# Patient Record
Sex: Male | Born: 1935 | Race: White | Hispanic: No | Marital: Married | Smoking: Former smoker
Health system: Southern US, Community
[De-identification: ages and names within clinical notes are randomized; demographics above are authoritative.]

## PROBLEM LIST (undated history)

## (undated) DIAGNOSIS — E785 Hyperlipidemia, unspecified: Secondary | ICD-10-CM

## (undated) DIAGNOSIS — C801 Malignant (primary) neoplasm, unspecified: Secondary | ICD-10-CM

## (undated) DIAGNOSIS — I10 Essential (primary) hypertension: Secondary | ICD-10-CM

## (undated) DIAGNOSIS — C61 Malignant neoplasm of prostate: Secondary | ICD-10-CM

## (undated) HISTORY — DX: Hyperlipidemia, unspecified: E78.5

## (undated) HISTORY — DX: Malignant neoplasm of prostate: C61

## (undated) HISTORY — PX: OTHER SURGICAL HISTORY: SHX169

---

## 2018-11-01 ENCOUNTER — Emergency Department (HOSPITAL_BASED_OUTPATIENT_CLINIC_OR_DEPARTMENT_OTHER): Payer: Medicare Other

## 2018-11-01 ENCOUNTER — Encounter (HOSPITAL_BASED_OUTPATIENT_CLINIC_OR_DEPARTMENT_OTHER): Payer: Self-pay | Admitting: Emergency Medicine

## 2018-11-01 ENCOUNTER — Other Ambulatory Visit: Payer: Self-pay

## 2018-11-01 ENCOUNTER — Emergency Department (HOSPITAL_BASED_OUTPATIENT_CLINIC_OR_DEPARTMENT_OTHER)
Admission: EM | Admit: 2018-11-01 | Discharge: 2018-11-01 | Disposition: A | Discharge status: Home / Self Care | Payer: Medicare Other | Attending: Emergency Medicine | Admitting: Emergency Medicine

## 2018-11-01 DIAGNOSIS — R0789 Other chest pain: Secondary | ICD-10-CM | POA: Diagnosis not present

## 2018-11-01 DIAGNOSIS — I10 Essential (primary) hypertension: Secondary | ICD-10-CM | POA: Insufficient documentation

## 2018-11-01 DIAGNOSIS — Z87891 Personal history of nicotine dependence: Secondary | ICD-10-CM | POA: Diagnosis not present

## 2018-11-01 HISTORY — DX: Malignant (primary) neoplasm, unspecified: C80.1

## 2018-11-01 HISTORY — DX: Essential (primary) hypertension: I10

## 2018-11-01 LAB — COMPREHENSIVE METABOLIC PANEL
ALT: 16 U/L (ref 0–44)
AST: 17 U/L (ref 15–41)
Albumin: 3.7 g/dL (ref 3.5–5.0)
Alkaline Phosphatase: 55 U/L (ref 38–126)
Anion gap: 11 (ref 5–15)
BUN: 13 mg/dL (ref 8–23)
CO2: 24 mmol/L (ref 22–32)
Calcium: 9 mg/dL (ref 8.9–10.3)
Chloride: 96 mmol/L — ABNORMAL LOW (ref 98–111)
Creatinine, Ser: 0.79 mg/dL (ref 0.61–1.24)
GFR calc Af Amer: >60 mL/min (ref >60)
GFR calc non Af Amer: >60 mL/min (ref >60)
Glucose, Bld: 104 mg/dL — ABNORMAL HIGH (ref 70–99)
Potassium: 3.2 mmol/L — ABNORMAL LOW (ref 3.5–5.1)
Sodium: 131 mmol/L — ABNORMAL LOW (ref 135–145)
Total Bilirubin: 1.1 mg/dL (ref 0.3–1.2)
Total Protein: 6.4 g/dL — ABNORMAL LOW (ref 6.5–8.1)

## 2018-11-01 LAB — D-DIMER, QUANTITATIVE: D-Dimer, Quant: 0.55 ug/mL-FEU — ABNORMAL HIGH (ref 0.00–0.50)

## 2018-11-01 LAB — CBC WITH DIFFERENTIAL/PLATELET
Abs Immature Granulocytes: 0.05 10*3/uL (ref 0.00–0.07)
Basophils Absolute: 0 10*3/uL (ref 0.0–0.1)
Basophils Relative: 0 %
Eosinophils Absolute: 0 10*3/uL (ref 0.0–0.5)
Eosinophils Relative: 0 %
HCT: 39 % (ref 39.0–52.0)
Hemoglobin: 13.2 g/dL (ref 13.0–17.0)
Immature Granulocytes: 0 %
Lymphocytes Relative: 5 %
Lymphs Abs: 0.5 10*3/uL — ABNORMAL LOW (ref 0.7–4.0)
MCH: 31.4 pg (ref 26.0–34.0)
MCHC: 33.8 g/dL (ref 30.0–36.0)
MCV: 92.9 fL (ref 80.0–100.0)
Monocytes Absolute: 1.1 10*3/uL — ABNORMAL HIGH (ref 0.1–1.0)
Monocytes Relative: 9 %
Neutro Abs: 10.3 10*3/uL — ABNORMAL HIGH (ref 1.7–7.7)
Neutrophils Relative %: 86 %
Platelets: 134 10*3/uL — ABNORMAL LOW (ref 150–400)
RBC: 4.2 MIL/uL — ABNORMAL LOW (ref 4.22–5.81)
RDW: 14 % (ref 11.5–15.5)
WBC: 12 10*3/uL — ABNORMAL HIGH (ref 4.0–10.5)
nRBC: 0 % (ref 0.0–0.2)

## 2018-11-01 LAB — TROPONIN I (HIGH SENSITIVITY)
Troponin I (High Sensitivity): 8 ng/L (ref <18)
Troponin I (High Sensitivity): 11 ng/L (ref <18)

## 2018-11-01 LAB — LIPASE, BLOOD: Lipase: 22 U/L (ref 11–51)

## 2018-11-01 MED ORDER — MAGNESIUM SULFATE 2 GM/50ML IV SOLN: 2.0000 g | Freq: Once | INTRAVENOUS | Status: DC

## 2018-11-01 MED ORDER — POTASSIUM CHLORIDE CRYS ER 20 MEQ PO TBCR
40.0000 meq | EXTENDED_RELEASE_TABLET | Freq: Once | ORAL | Status: DC
  Filled 2018-11-01: qty 2

## 2018-11-01 MED ORDER — POTASSIUM CHLORIDE 20 MEQ/15ML (10%) PO SOLN
40.0000 meq | Freq: Once | ORAL | Status: AC
  Administered 2018-11-01: 13:00:00 40 meq via ORAL
  Filled 2018-11-01: qty 30

## 2018-11-01 MED ORDER — MAGNESIUM SULFATE 2 GM/50ML IV SOLN
2.0000 g | Freq: Once | INTRAVENOUS | Status: AC
  Administered 2018-11-01: 12:00:00 2 g via INTRAVENOUS
  Filled 2018-11-01: qty 50

## 2018-11-01 NOTE — ED Notes (Signed)
Pt verbalized understanding of dc instructions.

## 2018-11-01 NOTE — ED Triage Notes (Signed)
Centralized chest pain x days. Worse with inspiration and lying flat.

## 2018-11-01 NOTE — Discharge Instructions (Signed)
Follow up with your family doc. Return for worsening symptoms.  °

## 2018-11-01 NOTE — ED Provider Notes (Signed)
[facility] EMERGENCY DEPARTMENT Provider Note   CSN: [id number] Arrival date & time: 11/01/18  [id number]     History   Chief Complaint Chief Complaint  Patient presents with  . Chest Pain    HPI William Bowen is a 85 y.o. male.     85 yo M with a chief complaints of chest pain.  Going on for a couple days.  Worse when he lies back flat or when he takes a deep breath.  Describes it as a sharp pain.  No radiation no shortness of breath.  The patient has a Public librarian and he called him and the physician discussed with him the different possible etiologies of his pain and recommended he come to the ED for evaluation.  Patient denies history of MI.  Has hypertension hyperlipidemia and family history of MI.  Patient has a remote smoking history denies diabetes.  Patient denies history of PE or DVT denies unilateral lower extremity edema denies hemoptysis denies recent surgery immobilization or hospitalization.  Has remote history of prostate cancer that has been in remission since 04.  The history is provided by the patient.  Chest Pain Pain location:  Substernal area Pain quality: sharp   Pain radiates to:  Does not radiate Pain severity:  Moderate Onset quality:  Sudden Duration:  2 days Timing:  Constant Progression:  Waxing and waning Chronicity:  New Relieved by:  Nothing Worsened by:  Nothing Ineffective treatments:  None tried Associated symptoms: no abdominal pain, no fever, no headache, no palpitations, no shortness of breath and no vomiting     Past Medical History:  Diagnosis Date  . Cancer ([place])   . Hypertension     There are no active problems to display for this patient.   History reviewed. No pertinent surgical history.      Home Medications    Prior to Admission medications   Not on File    Family History No family history on file.  Social History Social History   Tobacco Use  . Smoking status: Former Research scientist (life sciences)  . Smokeless  tobacco: Never Used  Substance Use Topics  . Alcohol use: Not Currently  . Drug use: Not on file     Allergies   Patient has no known allergies.   Review of Systems Review of Systems  Constitutional: Negative for chills and fever.  HENT: Negative for congestion and facial swelling.   Eyes: Negative for discharge and visual disturbance.  Respiratory: Negative for shortness of breath.   Cardiovascular: Positive for chest pain. Negative for palpitations.  Gastrointestinal: Negative for abdominal pain, diarrhea and vomiting.  Musculoskeletal: Negative for arthralgias and myalgias.  Skin: Negative for color change and rash.  Neurological: Negative for tremors, syncope and headaches.  Psychiatric/Behavioral: Negative for confusion and dysphoric mood.     Physical Exam Updated Vital Signs BP (!) 142/56   Pulse 63   Temp 97.9 F (36.6 C) (Oral)   Resp 14   Ht 5\' 9"  (1.753 m)   Wt 65.8 kg   SpO2 97%   BMI 21.41 kg/m   Physical Exam Vitals signs and nursing note reviewed.  Constitutional:      Appearance: He is well-developed.  HENT:     Head: Normocephalic and atraumatic.  Eyes:     Pupils: Pupils are equal, round, and reactive to light.  Neck:     Musculoskeletal: Normal range of motion and neck supple.     Vascular: No JVD.  Cardiovascular:  Rate and Rhythm: Normal rate and regular rhythm.     Heart sounds: No murmur. No friction rub. No gallop.   Pulmonary:     Effort: No respiratory distress.     Breath sounds: No wheezing.  Chest:     Chest wall: Tenderness present.     Comments: Patient points to the area with one finger on his left anterior chest wall just left of the sternal border about ribs 5 and 6.  Has some tenderness with palpation. Abdominal:     General: There is no distension.     Tenderness: There is no guarding or rebound.     Comments: Patient has some mild discomfort when I push in the epigastrium that he feels like radiates up into his  chest.  Musculoskeletal: Normal range of motion.  Skin:    Coloration: Skin is not pale.     Findings: No rash.  Neurological:     Mental Status: He is alert and oriented to person, place, and time.  Psychiatric:        Behavior: Behavior normal.      ED Treatments / Results  Labs (all labs ordered are listed, but only abnormal results are displayed) Labs Reviewed  CBC WITH DIFFERENTIAL/PLATELET - Abnormal; Notable for the following components:      Result Value   WBC 12.0 (*)    RBC 4.20 (*)    Platelets 134 (*)    Neutro Abs 10.3 (*)    Lymphs Abs 0.5 (*)    Monocytes Absolute 1.1 (*)    All other components within normal limits  COMPREHENSIVE METABOLIC PANEL - Abnormal; Notable for the following components:   Sodium 131 (*)    Potassium 3.2 (*)    Chloride 96 (*)    Glucose, Bld 104 (*)    Total Protein 6.4 (*)    All other components within normal limits  D-DIMER, QUANTITATIVE (NOT AT [facility]) - Abnormal; Notable for the following components:   D-Dimer, Quant 0.55 (*)    All other components within normal limits  LIPASE, BLOOD  TROPONIN I (HIGH SENSITIVITY)  TROPONIN I (HIGH SENSITIVITY)    EKG EKG Interpretation  Date/Time:  Saturday November 01 2018 09:59:01 EDT Ventricular Rate:  105 PR Interval:    QRS Duration: 139 QT Interval:  363 QTC Calculation: 480 R Axis:   65 Text Interpretation: Normal sinus rhythm Ventricular bigeminy Right bundle branch block Baseline wander in lead(s) V3 No old tracing to compare Confirmed by Deno Etienne [id number]) on 11/01/2018 10:25:34 AM   Radiology Dg Chest Port 1 View  Result Date: 11/01/2018 CLINICAL DATA:  Centralized chest pain for the past 2 days, worse with inspiration and lying flat. EXAM: PORTABLE CHEST 1 VIEW COMPARISON:  None. FINDINGS: Heart size near the upper limit of normal. Mild bibasilar atelectasis. Otherwise, clear lungs. Thoracic spine degenerative changes. Diffuse osteopenia. IMPRESSION: Mild bibasilar  atelectasis. Electronically Signed   By: Claudie Revering M.D.   On: 11/01/2018 10:39    Procedures Procedures (including critical care time)  Medications Ordered in ED Medications  potassium chloride SA (KLOR-CON) CR tablet 40 mEq (40 mEq Oral Not Given 11/01/18 1211)  magnesium sulfate IVPB 2 g 50 mL (0 g Intravenous Stopped 11/01/18 1315)  potassium chloride 20 MEQ/15ML (10%) solution 40 mEq (40 mEq Oral Given 11/01/18 1315)     Initial Impression / Assessment and Plan / ED Course  I have reviewed the triage vital signs and the nursing notes.  Pertinent  labs & imaging results that were available during my care of the patient were reviewed by me and considered in my medical decision making (see chart for details).        85 yo M with a chief complaint of chest pain.  This is atypical in nature from ACS.  He has been able to exert himself and walk up to a mile and a half without any symptoms of shortness of breath.  Pain is worse with laying back flat or taking deep breaths.  Could be muscular based on his history of those not definitively reproduced with palpation.  Will obtain troponin and D-dimer reassess.   Ddimer age adjusted negative.  Trop negative x 2. labwork otherwise unremarkable.  CXR viewed by me without focal infiltrate or ptx.  D/c home, PCP follow up.   3:10 PM:  I have discussed the diagnosis/risks/treatment options with the patient and family and believe the pt to be eligible for discharge home to follow-up with PCP. We also discussed returning to the ED immediately if new or worsening sx occur. We discussed the sx which are most concerning (e.g., sudden worsening pain, fever, inability to tolerate by mouth) that necessitate immediate return. Medications administered to the patient during their visit and any new prescriptions provided to the patient are listed below.  Medications given during this visit Medications  potassium chloride SA (KLOR-CON) CR tablet 40 mEq (40  mEq Oral Not Given 11/01/18 1211)  magnesium sulfate IVPB 2 g 50 mL (0 g Intravenous Stopped 11/01/18 1315)  potassium chloride 20 MEQ/15ML (10%) solution 40 mEq (40 mEq Oral Given 11/01/18 1315)     The patient appears reasonably screen and/or stabilized for discharge and I doubt any other medical condition or other [facility] requiring further screening, evaluation, or treatment in the ED at this time prior to discharge.   Final Clinical Impressions(s) / ED Diagnoses   Final diagnoses:  Atypical chest pain    ED Discharge Orders    None       Deno Etienne, DO 11/01/18 1510

## 2020-01-29 ENCOUNTER — Emergency Department (HOSPITAL_BASED_OUTPATIENT_CLINIC_OR_DEPARTMENT_OTHER)
Admission: EM | Admit: 2020-01-29 | Discharge: 2020-01-29 | Disposition: A | Discharge status: Home / Self Care | Payer: Medicare Other | Attending: Emergency Medicine | Admitting: Emergency Medicine

## 2020-01-29 ENCOUNTER — Encounter (HOSPITAL_BASED_OUTPATIENT_CLINIC_OR_DEPARTMENT_OTHER): Payer: Self-pay

## 2020-01-29 ENCOUNTER — Other Ambulatory Visit: Payer: Self-pay

## 2020-01-29 DIAGNOSIS — I10 Essential (primary) hypertension: Secondary | ICD-10-CM | POA: Diagnosis not present

## 2020-01-29 DIAGNOSIS — Z87891 Personal history of nicotine dependence: Secondary | ICD-10-CM | POA: Insufficient documentation

## 2020-01-29 DIAGNOSIS — Z859 Personal history of malignant neoplasm, unspecified: Secondary | ICD-10-CM | POA: Diagnosis not present

## 2020-01-29 DIAGNOSIS — R202 Paresthesia of skin: Secondary | ICD-10-CM | POA: Insufficient documentation

## 2020-01-29 LAB — COMPREHENSIVE METABOLIC PANEL
ALT: 16 U/L (ref 0–44)
AST: 16 U/L (ref 15–41)
Albumin: 3.7 g/dL (ref 3.5–5.0)
Alkaline Phosphatase: 44 U/L (ref 38–126)
Anion gap: 10 (ref 5–15)
BUN: 19 mg/dL (ref 8–23)
CO2: 27 mmol/L (ref 22–32)
Calcium: 9 mg/dL (ref 8.9–10.3)
Chloride: 96 mmol/L — ABNORMAL LOW (ref 98–111)
Creatinine, Ser: 0.92 mg/dL (ref 0.61–1.24)
GFR, Estimated: >60 mL/min (ref >60)
Glucose, Bld: 103 mg/dL — ABNORMAL HIGH (ref 70–99)
Potassium: 3 mmol/L — ABNORMAL LOW (ref 3.5–5.1)
Sodium: 133 mmol/L — ABNORMAL LOW (ref 135–145)
Total Bilirubin: 0.8 mg/dL (ref 0.3–1.2)
Total Protein: 6.1 g/dL — ABNORMAL LOW (ref 6.5–8.1)

## 2020-01-29 LAB — CBC WITH DIFFERENTIAL/PLATELET
Abs Immature Granulocytes: 0.05 10*3/uL (ref 0.00–0.07)
Basophils Absolute: 0 10*3/uL (ref 0.0–0.1)
Basophils Relative: 0 %
Eosinophils Absolute: 0.1 10*3/uL (ref 0.0–0.5)
Eosinophils Relative: 1 %
HCT: 34.1 % — ABNORMAL LOW (ref 39.0–52.0)
Hemoglobin: 11.9 g/dL — ABNORMAL LOW (ref 13.0–17.0)
Immature Granulocytes: 1 %
Lymphocytes Relative: 12 %
Lymphs Abs: 0.8 10*3/uL (ref 0.7–4.0)
MCH: 32.2 pg (ref 26.0–34.0)
MCHC: 34.9 g/dL (ref 30.0–36.0)
MCV: 92.4 fL (ref 80.0–100.0)
Monocytes Absolute: 0.6 10*3/uL (ref 0.1–1.0)
Monocytes Relative: 9 %
Neutro Abs: 5.1 10*3/uL (ref 1.7–7.7)
Neutrophils Relative %: 77 %
Platelets: 162 10*3/uL (ref 150–400)
RBC: 3.69 MIL/uL — ABNORMAL LOW (ref 4.22–5.81)
RDW: 13.7 % (ref 11.5–15.5)
WBC: 6.6 10*3/uL (ref 4.0–10.5)
nRBC: 0 % (ref 0.0–0.2)

## 2020-01-29 MED ORDER — POTASSIUM CHLORIDE CRYS ER 20 MEQ PO TBCR
40.0000 meq | EXTENDED_RELEASE_TABLET | Freq: Once | ORAL | Status: AC
  Administered 2020-01-29: 40 meq via ORAL
  Filled 2020-01-29: qty 2

## 2020-01-29 NOTE — ED Triage Notes (Signed)
Pt states he woke ~830am with numbness to left pinky finger/side of hand-denies injury-NAD-steady gait

## 2020-01-29 NOTE — ED Notes (Signed)
Numbness to side of left hand  Woke with it this am

## 2020-01-29 NOTE — Discharge Instructions (Addendum)
I do not suspect a stroke given your symptoms only affect the fourth and fifth digits of your left hand.  If Symptoms persist for more than 1 or 2 days, you may benefit from nerve conduction studies.  Call your doctor tomorrow morning to arrange follow-up as needed.  Return immediately to the ER if you have new numbness weakness or any additional concerns.

## 2020-01-29 NOTE — ED Notes (Signed)
Discussed case with EDP-orders received

## 2020-01-29 NOTE — ED Notes (Signed)
EKG to EDP Sch[place]

## 2020-01-29 NOTE — ED Provider Notes (Signed)
[facility] EMERGENCY DEPARTMENT Provider Note   CSN: [id number] Arrival date & time: 01/29/20  1348     History Chief Complaint  Patient presents with  . Numbness    William Bowen is a 87 y.o. male.  Patient presents to ER chief complaint of left hand fifth digit and fourth digit tingling and numbness.  He states his symptoms began when he woke up this morning around 7 AM.  He states he went to bed last night feeling fine.  When he woke up he noticed tingling in his left hand.  Persisted through the day and he called his doctor who advised him to come to the ER for further evaluation.  Patient otherwise denies any pain or discomfort.  Denies any weakness.  Denies any headache or chest pain or abdominal pain.        Past Medical History:  Diagnosis Date  . Cancer ([place])   . Hypertension     There are no problems to display for this patient.   History reviewed. No pertinent surgical history.     No family history on file.  Social History   Tobacco Use  . Smoking status: Former Research scientist (life sciences)  . Smokeless tobacco: Never Used  Substance Use Topics  . Alcohol use: Not Currently  . Drug use: Never    Home Medications Prior to Admission medications   Not on File    Allergies    Patient has no known allergies.  Review of Systems   Review of Systems  Constitutional: Negative for fever.  HENT: Negative for ear pain and sore throat.   Eyes: Negative for pain.  Respiratory: Negative for cough.   Cardiovascular: Negative for chest pain.  Gastrointestinal: Negative for abdominal pain.  Genitourinary: Negative for flank pain.  Musculoskeletal: Negative for back pain.  Skin: Negative for color change and rash.  Neurological: Negative for syncope.  All other systems reviewed and are negative.   Physical Exam Updated Vital Signs BP (!) 136/56 (BP Location: Left Arm)   Pulse 65   Temp (!) 97.5 F (36.4 C) (Tympanic)   Resp 18   Ht 5\' 9"  (1.753 m)   Wt  73.9 kg   SpO2 99%   BMI 24.07 kg/m   Physical Exam Constitutional:      General: He is not in acute distress.    Appearance: He is well-developed.  HENT:     Head: Normocephalic.     Nose: Nose normal.  Eyes:     Extraocular Movements: Extraocular movements intact.  Cardiovascular:     Rate and Rhythm: Normal rate.  Pulmonary:     Effort: Pulmonary effort is normal.  Musculoskeletal:     Comments: Chronic appearing deformity the left elbow.  Patient is unable to fully supinate the left forearm due to the deformity of the left elbow.  Skin:    Coloration: Skin is not jaundiced.  Neurological:     Mental Status: He is alert. Mental status is at baseline.     Comments: Cranial nerves II to XII intact.  Strength 5/5 all extremities.  Finger-nose heel-to-shin intact.  No hand/forearm drifting noted, grip strength bilaterally 5/5 strength.  No weakness to bilateral finger abduction and adduction.       ED Results / Procedures / Treatments   Labs (all labs ordered are listed, but only abnormal results are displayed) Labs Reviewed  CBC WITH DIFFERENTIAL/PLATELET - Abnormal; Notable for the following components:      Result Value  RBC 3.69 (*)    Hemoglobin 11.9 (*)    HCT 34.1 (*)    All other components within normal limits  COMPREHENSIVE METABOLIC PANEL - Abnormal; Notable for the following components:   Sodium 133 (*)    Potassium 3.0 (*)    Chloride 96 (*)    Glucose, Bld 103 (*)    Total Protein 6.1 (*)    All other components within normal limits    EKG None  Radiology No results found.  Procedures Procedures   Medications Ordered in ED Medications - No data to display  ED Course  I have reviewed the triage vital signs and the nursing notes.  Pertinent labs & imaging results that were available during my care of the patient were reviewed by me and considered in my medical decision making (see chart for details).    MDM Rules/Calculators/A&P                           Given exam here I doubt CVA or intracranial pathology.  I suspect nerve palsy perhaps compression neuropathy.  Likely exacerbated by the patient's chronic deformity of the left elbow.  No other focal neuro deficit noted.  Recommending outpatient follow-up with his doctors perhaps follow-up with neurology for nerve conduction studies as needed within the week.  Advised immediate return for new numbness weakness or any additional concerns.   Final Clinical Impression(s) / ED Diagnoses Final diagnoses:  None    Rx / DC Orders ED Discharge Orders    None       Luna Fuse, MD 01/29/20 [phone]

## 2020-02-02 DEATH — deceased

## 2020-05-19 IMAGING — DX DG CHEST 1V PORT
1 series · 1 of 1 positions shown · non-contrast
Comparison: None.

CLINICAL DATA: Centralized chest pain for the past 2 days, worse
with inspiration and lying flat.

EXAM:
PORTABLE CHEST 1 VIEW

[chest ap]
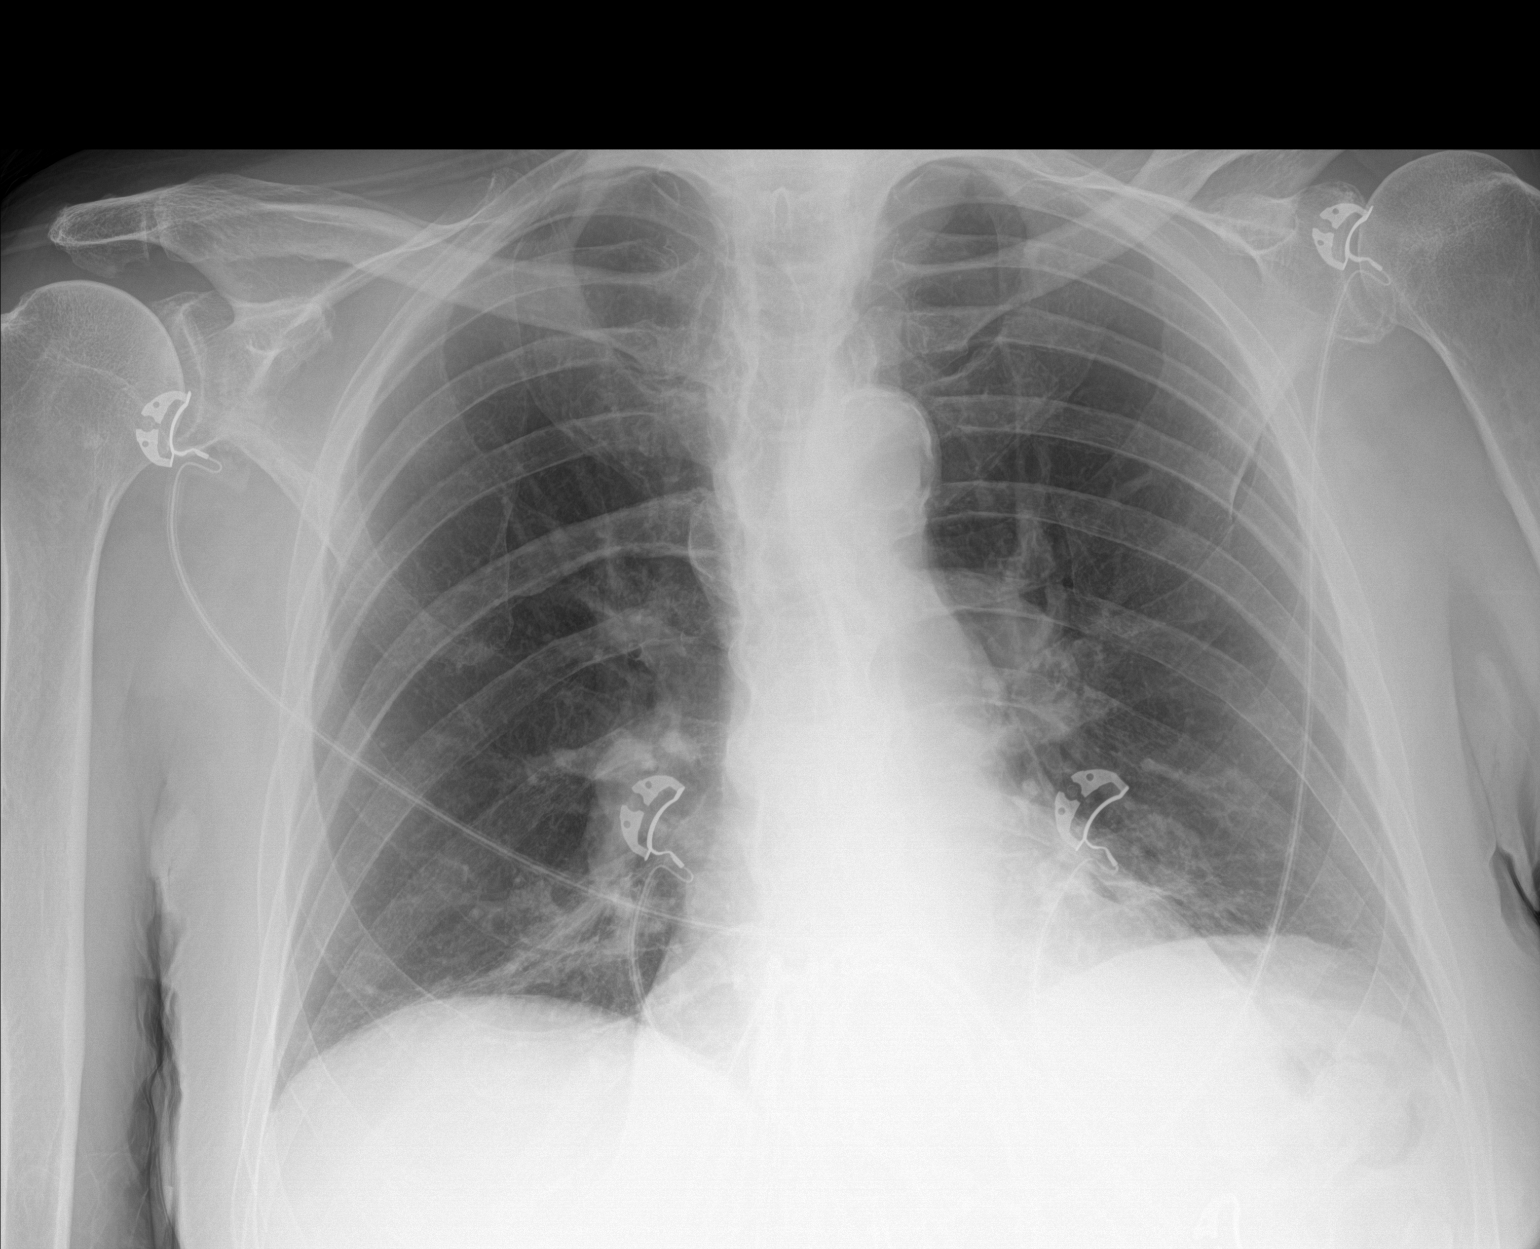

[1 of 1 positions shown; findings below may reference images not displayed]

FINDINGS: Heart size near the upper limit of normal. Mild bibasilar
atelectasis. Otherwise, clear lungs. Thoracic spine degenerative
changes. Diffuse osteopenia.
IMPRESSION: Mild bibasilar atelectasis.

## 2020-12-05 ENCOUNTER — Other Ambulatory Visit: Payer: Self-pay | Admitting: *Deleted

## 2020-12-28 NOTE — Progress Notes (Signed)
William Levins, MD Reason for referral-hypertension, hyperlipidemia and PVCs  HPI: 87 year old male for evaluation of hypertension, hyperlipidemia and PVCs at request of Margretta Sidle, MD.  Carotid Dopplers October 2022 showed no significant stenosis with less than 50% bilaterally.  Abdominal ultrasound October 2022 showed no aneurysm.  Recently noted to have PVCs as well.  Cardiology now asked to evaluate.  Patient denies dyspnea, chest pain, palpitations or syncope.  He occasionally has pedal edema for which she takes as needed Lasix.  Current Outpatient Medications  Medication Sig Dispense Refill   aspirin 81 MG EC tablet Take 81 mg by mouth daily.     atorvastatin (LIPITOR) 40 MG tablet Take 40 mg by mouth daily.     bimatoprost (LUMIGAN) 0.01 % SOLN Place 1 drop into both eyes at bedtime.     cilostazol (PLETAL) 50 MG tablet Take 50 mg by mouth 2 (two) times daily.     cloNIDine (CATAPRES) 0.1 MG tablet Take 3 tablets by mouth 3 (three) times daily.     felodipine (PLENDIL) 5 MG 24 hr tablet Take 1 tablet (5 mg total) by mouth 2 (two) times daily.     ferrous sulfate 325 (65 FE) MG tablet Take 325 mg by mouth 2 (two) times daily.     hydrALAZINE (APRESOLINE) 50 MG tablet Take 100 mg by mouth in the morning and at bedtime.     memantine (NAMENDA) 5 MG tablet Take 1 tablet (5 mg total) by mouth 2 (two) times daily.     omeprazole (PRILOSEC) 40 MG capsule Take 40 mg by mouth at bedtime.     tamsulosin (FLOMAX) 0.4 MG CAPS capsule Take 2 capsules by mouth daily.     temazepam (RESTORIL) 7.5 MG capsule Take 15 mg by mouth at bedtime.     furosemide (LASIX) 20 MG tablet Take 20 mg by mouth daily as needed. (Patient not taking: Reported on 01/09/2021)     potassium chloride (KLOR-CON) 10 MEQ tablet Take 1 tablet (10 mEq total) by mouth daily. (Patient not taking: Reported on 01/09/2021) 90 tablet 3   No current facility-administered medications for this visit.    No Known  Allergies   Past Medical History:  Diagnosis Date   Hyperlipidemia    Hypertension    Prostate cancer ([place])     Past Surgical History:  Procedure Laterality Date   broken arm     prostate seed implants      Social History   Socioeconomic History   Marital status: Married    Spouse name: Not on file   Number of children: 3   Years of education: Not on file   Highest education level: Not on file  Occupational History   Not on file  Tobacco Use   Smoking status: Former   Smokeless tobacco: Never  Substance and Sexual Activity   Alcohol use: Not Currently   Drug use: Never   Sexual activity: Not on file  Other Topics Concern   Not on file  Social History Narrative   Not on file   Social Determinants of Health   Financial Resource Strain: Not on file  Food Insecurity: Not on file  Transportation Needs: Not on file  Physical Activity: Not on file  Stress: Not on file  Social Connections: Not on file  Intimate Partner Violence: Not on file    Family History  Problem Relation Age of Onset   AAA (abdominal aortic aneurysm) Mother     ROS: no  fevers or chills, productive cough, hemoptysis, dysphasia, odynophagia, melena, hematochezia, dysuria, hematuria, rash, seizure activity, orthopnea, PND, pedal edema, claudication. Remaining systems are negative.  Physical Exam:   Blood pressure (!) 154/60, pulse 90, height 5\' 10"  (1.778 m), weight 170 lb 6.4 oz (77.3 kg), SpO2 96 %.  General:  Well developed/well nourished in NAD Skin warm/dry Patient not depressed No peripheral clubbing Back-normal HEENT-normal/normal eyelids Neck supple/normal carotid upstroke bilaterally; no bruits; no JVD; no thyromegaly chest - CTA/ normal expansion CV - irregular/normal S1 and S2; no murmurs, rubs or gallops;  PMI nondisplaced Abdomen -NT/ND, no HSM, no mass, + bowel sounds, no bruit 2+ femoral pulses, no bruits Ext-trace edema, no chords, 2+ DP, chronic skin  changes Neuro-grossly nonfocal  ECG -sinus rhythm with ventricular bigeminy, right bundle branch block.  Personally reviewed  A/P  1 hypertension-patient's blood pressure is borderline today but he did bring records and it is typically controlled.  We will continue present medications and follow.  2 hyperlipidemia-continue statin.  3 abnormal ECG/PVCs-patient is in ventricular bigeminy today.  We will arrange echocardiogram to assess LV function.  He has not symptomatic and we will therefore not add beta-blockade unless his LV function is reduced.  Kirk Ruths, MD

## 2021-01-09 ENCOUNTER — Ambulatory Visit (INDEPENDENT_AMBULATORY_CARE_PROVIDER_SITE_OTHER): Payer: Medicare Other | Admitting: Cardiology

## 2021-01-09 ENCOUNTER — Other Ambulatory Visit: Payer: Self-pay

## 2021-01-09 ENCOUNTER — Encounter: Payer: Self-pay | Admitting: Cardiology

## 2021-01-09 VITALS — BP 154/60 | HR 90 | Ht 70.0 in | Wt 170.4 lb

## 2021-01-09 DIAGNOSIS — I10 Essential (primary) hypertension: Secondary | ICD-10-CM

## 2021-01-09 DIAGNOSIS — E7800 Pure hypercholesterolemia, unspecified: Secondary | ICD-10-CM

## 2021-01-09 DIAGNOSIS — R9431 Abnormal electrocardiogram [ECG] [EKG]: Secondary | ICD-10-CM

## 2021-01-09 NOTE — Patient Instructions (Signed)
°  Testing/Procedures:  Your physician has requested that you have an echocardiogram. Echocardiography is a painless test that uses sound waves to create images of your heart. It provides your doctor with information about the size and shape of your heart and how well your hearts chambers and valves are working. This procedure takes approximately one hour. There are no restrictions for this procedure. [address] [place]   Follow-Up: At [facility], you and your health needs are our priority.  As part of our continuing mission to provide you with exceptional heart care, we have created designated Provider Care Teams.  These Care Teams include your primary Cardiologist (physician) and Advanced Practice Providers (APPs -  Physician Assistants and Nurse Practitioners) who all work together to provide you with the care you need, when you need it.  We recommend signing up for the patient portal called "MyChart".  Sign up information is provided on this After Visit Summary.  MyChart is used to connect with patients for Virtual Visits (Telemedicine).  Patients are able to view lab/test results, encounter notes, upcoming appointments, etc.  Non-urgent messages can be sent to your provider as well.   To learn more about what you can do with MyChart, go to [email or web address].    Your next appointment:   12 month(s)  The format for your next appointment:   In Person  Provider:   Kirk Ruths MD {

## 2021-01-24 ENCOUNTER — Other Ambulatory Visit: Payer: Self-pay

## 2021-01-24 ENCOUNTER — Ambulatory Visit (HOSPITAL_BASED_OUTPATIENT_CLINIC_OR_DEPARTMENT_OTHER)
Admission: RE | Admit: 2021-01-24 | Discharge: 2021-01-24 | Disposition: A | Discharge status: Home / Self Care | Payer: Medicare Other | Source: Ambulatory Visit | Attending: Cardiology | Admitting: Cardiology

## 2021-01-24 DIAGNOSIS — R9431 Abnormal electrocardiogram [ECG] [EKG]: Secondary | ICD-10-CM | POA: Insufficient documentation

## 2021-01-24 LAB — ECHOCARDIOGRAM COMPLETE
AR max vel: 3.92 cm2
AV Area VTI: 4.37 cm2
AV Area mean vel: 4.11 cm2
AV Mean grad: 7 mmHg
AV Peak grad: 12.8 mmHg
Ao pk vel: 1.79 m/s
Area-P 1/2: 3.37 cm2
P 1/2 time: 399 msec
S' Lateral: 3.3 cm

## 2021-04-01 ENCOUNTER — Emergency Department (HOSPITAL_COMMUNITY)
Admission: EM | Admit: 2021-04-01 | Discharge: 2021-04-01 | Disposition: A | Discharge status: Home / Self Care | Payer: Medicare Other | Attending: Emergency Medicine | Admitting: Emergency Medicine

## 2021-04-01 ENCOUNTER — Encounter (HOSPITAL_COMMUNITY): Payer: Self-pay | Admitting: Emergency Medicine

## 2021-04-01 DIAGNOSIS — Z79899 Other long term (current) drug therapy: Secondary | ICD-10-CM | POA: Insufficient documentation

## 2021-04-01 DIAGNOSIS — I10 Essential (primary) hypertension: Secondary | ICD-10-CM | POA: Diagnosis not present

## 2021-04-01 DIAGNOSIS — E876 Hypokalemia: Secondary | ICD-10-CM | POA: Diagnosis not present

## 2021-04-01 DIAGNOSIS — Z7982 Long term (current) use of aspirin: Secondary | ICD-10-CM | POA: Insufficient documentation

## 2021-04-01 DIAGNOSIS — R001 Bradycardia, unspecified: Secondary | ICD-10-CM | POA: Diagnosis present

## 2021-04-01 LAB — COMPREHENSIVE METABOLIC PANEL
ALT: 17 U/L (ref 0–44)
AST: 16 U/L (ref 15–41)
Albumin: 3.6 g/dL (ref 3.5–5.0)
Alkaline Phosphatase: 60 U/L (ref 38–126)
Anion gap: 9 (ref 5–15)
BUN: 18 mg/dL (ref 8–23)
CO2: 24 mmol/L (ref 22–32)
Calcium: 9.4 mg/dL (ref 8.9–10.3)
Chloride: 104 mmol/L (ref 98–111)
Creatinine, Ser: 0.84 mg/dL (ref 0.61–1.24)
GFR, Estimated: >60 mL/min (ref >60)
Glucose, Bld: 109 mg/dL — ABNORMAL HIGH (ref 70–99)
Potassium: 3.2 mmol/L — ABNORMAL LOW (ref 3.5–5.1)
Sodium: 137 mmol/L (ref 135–145)
Total Bilirubin: 0.9 mg/dL (ref 0.3–1.2)
Total Protein: 6.3 g/dL — ABNORMAL LOW (ref 6.5–8.1)

## 2021-04-01 LAB — CBC WITH DIFFERENTIAL/PLATELET
Abs Immature Granulocytes: 0.06 10*3/uL (ref 0.00–0.07)
Basophils Absolute: 0 10*3/uL (ref 0.0–0.1)
Basophils Relative: 0 %
Eosinophils Absolute: 0 10*3/uL (ref 0.0–0.5)
Eosinophils Relative: 0 %
HCT: 39.7 % (ref 39.0–52.0)
Hemoglobin: 13.4 g/dL (ref 13.0–17.0)
Immature Granulocytes: 1 %
Lymphocytes Relative: 8 %
Lymphs Abs: 0.7 10*3/uL (ref 0.7–4.0)
MCH: 31.3 pg (ref 26.0–34.0)
MCHC: 33.8 g/dL (ref 30.0–36.0)
MCV: 92.8 fL (ref 80.0–100.0)
Monocytes Absolute: 0.7 10*3/uL (ref 0.1–1.0)
Monocytes Relative: 8 %
Neutro Abs: 7.5 10*3/uL (ref 1.7–7.7)
Neutrophils Relative %: 83 %
Platelets: 147 10*3/uL — ABNORMAL LOW (ref 150–400)
RBC: 4.28 MIL/uL (ref 4.22–5.81)
RDW: 13.9 % (ref 11.5–15.5)
WBC: 9 10*3/uL (ref 4.0–10.5)
nRBC: 0 % (ref 0.0–0.2)

## 2021-04-01 LAB — MAGNESIUM: Magnesium: 1.5 mg/dL — ABNORMAL LOW (ref 1.7–2.4)

## 2021-04-01 MED ORDER — MAGNESIUM SULFATE 2 GM/50ML IV SOLN
2.0000 g | Freq: Once | INTRAVENOUS | Status: AC
  Administered 2021-04-01: 2 g via INTRAVENOUS
  Filled 2021-04-01: qty 50

## 2021-04-01 MED ORDER — POTASSIUM CHLORIDE CRYS ER 20 MEQ PO TBCR
40.0000 meq | EXTENDED_RELEASE_TABLET | Freq: Once | ORAL | Status: AC
  Administered 2021-04-01: 40 meq via ORAL
  Filled 2021-04-01: qty 2

## 2021-04-01 NOTE — ED Triage Notes (Signed)
Patient BIB [facility] from [facility] after he called 911 because his home pulse ox this morning showed his heart rate was 35, pulse palpated at 100 beats per minute. 20g saline lock in left hand. Patient has no complaints and is alert, oriented, ambulatory, and in no apparent distress at this time. ? ?BP 154/62 ?HR 100 ?SpO 97% on room air ?CBG 148 ?

## 2021-04-01 NOTE — ED Provider Notes (Signed)
?[facility] ?Provider Note ? ? ?CSN: [id number] ?Arrival date & time: 04/01/21  1301 ? ?  ? ?History ? ?No chief complaint on file. ? ? ?William Bowen is a 88 y.o. male. ? ?HPI ?Patient presents via EMS with concern of bradycardia.  EMS reports that the patient was hemodynamically unremarkable in transport.  The patient notes that he checked his pulse ox this morning, found the heart rate to be 35 call his physician and was sent here for evaluation.  He denies physical complaints, dyspnea.  He notes an echocardiogram was performed about 3 weeks ago, otherwise no recent medication, diet, activity change or physician contact. ?He currently has no complaints.  EMS reports the patient was hemodynamically unremarkable in route did have evidence for bigeminy on ECG, but had a palpable pulse of about 100. ?  ? ?Home Medications ?Prior to Admission medications   ?Medication Sig Start Date End Date Taking? Authorizing Provider  ?aspirin 81 MG EC tablet Take 81 mg by mouth daily.    [provider]  ?atorvastatin (LIPITOR) 40 MG tablet Take 40 mg by mouth daily.    [provider]  ?bimatoprost (LUMIGAN) 0.01 % SOLN Place 1 drop into both eyes at bedtime. 12/05/20   Lelon Perla, MD  ?cilostazol (PLETAL) 50 MG tablet Take 50 mg by mouth 2 (two) times daily.    [provider]  ?cloNIDine (CATAPRES) 0.1 MG tablet Take 3 tablets by mouth 3 (three) times daily.    [provider]  ?felodipine (PLENDIL) 5 MG 24 hr tablet Take 1 tablet (5 mg total) by mouth 2 (two) times daily. 12/05/20   Lelon Perla, MD  ?ferrous sulfate 325 (65 FE) MG tablet Take 325 mg by mouth 2 (two) times daily.    [provider]  ?furosemide (LASIX) 20 MG tablet Take 20 mg by mouth daily as needed. ?Patient not taking: Reported on 01/09/2021 09/12/19   [provider]  ?hydrALAZINE (APRESOLINE) 50 MG tablet Take 100 mg by mouth in the morning and at bedtime.     [provider]  ?memantine (NAMENDA) 5 MG tablet Take 1 tablet (5 mg total) by mouth 2 (two) times daily. 12/05/20   Lelon Perla, MD  ?omeprazole (PRILOSEC) 40 MG capsule Take 40 mg by mouth at bedtime.    [provider]  ?potassium chloride (KLOR-CON) 10 MEQ tablet Take 1 tablet (10 mEq total) by mouth daily. ?Patient not taking: Reported on 01/09/2021 12/05/20   Lelon Perla, MD  ?tamsulosin (FLOMAX) 0.4 MG CAPS capsule Take 2 capsules by mouth daily.    [provider]  ?temazepam (RESTORIL) 7.5 MG capsule Take 15 mg by mouth at bedtime.    [provider]  ?   ? ?Allergies    ?Patient has no known allergies.   ? ?Review of Systems   ?Review of Systems  ?Constitutional:   ?     Per HPI, otherwise negative  ?HENT:    ?     Per HPI, otherwise negative  ?Respiratory:    ?     Per HPI, otherwise negative  ?Cardiovascular:   ?     Per HPI, otherwise negative  ?Gastrointestinal:  Negative for vomiting.  ?Endocrine:  ?     Negative aside from HPI  ?Genitourinary:   ?     Neg aside from HPI   ?Musculoskeletal:   ?     Per HPI, otherwise negative  ?  Skin: Negative.   ?Neurological:  Negative for syncope.  ? ?Physical Exam ?Updated Vital Signs ?There were no vitals taken for this visit. ?Physical Exam ?Vitals and nursing note reviewed.  ?Constitutional:   ?   General: He is not in acute distress. ?   Appearance: He is well-developed.  ?HENT:  ?   Head: Normocephalic and atraumatic.  ?Eyes:  ?   Conjunctiva/sclera: Conjunctivae normal.  ?Cardiovascular:  ?   Rate and Rhythm: Normal rate and regular rhythm.  ?Pulmonary:  ?   Effort: Pulmonary effort is normal. No respiratory distress.  ?   Breath sounds: No stridor.  ?Abdominal:  ?   General: There is no distension.  ?Skin: ?   General: Skin is warm and dry.  ?Neurological:  ?   Mental Status: He is alert and oriented to person, place, and time.  ? ? ?ED Results / Procedures / Treatments   ?Labs ?(all labs ordered are listed, but  only abnormal results are displayed) ?Labs Reviewed  ?COMPREHENSIVE METABOLIC PANEL  ?CBC WITH DIFFERENTIAL/PLATELET  ?MAGNESIUM  ? ? ?EKG ?None ? ?Radiology ?No results found. ? ?Procedures ?Procedures  ? ? ?Medications Ordered in ED ?Medications - No data to display ? ?ED Course/ Medical Decision Making/ A&P ?This patient with a Hx of hypertension, cardiac disease presents to the ED for concern of possible bradycardia, this involves an extensive number of treatment options, and is a complaint that carries with it a high risk of complications and morbidity.   ? ?The differential diagnosis includes bradycardia, electrolyte abnormalities, unusual ACS, infection ? ? ?Social Determinants of Health: ? ?Age ? ?Additional history obtained: ? ?Additional history and/or information obtained from EMS, notable for transport documentation as above, discussed at length ?Cardiology notes including review of echocardiogram with normal LV function, per Dr. Stanford Breed, and consult note from 2 months ago with details as below: ?HPI: 88 year old male for evaluation of hypertension, hyperlipidemia and PVCs at request of Margretta Sidle, MD.  Carotid Dopplers October 2022 showed no significant stenosis with less than 50% bilaterally.  Abdominal ultrasound October 2022 showed no aneurysm.  Recently noted to have PVCs as well.  Cardiology now asked to evaluate.  Patient denies dyspnea, chest pain, palpitations or syncope.  He occasionally has pedal edema for which she takes as needed Lasix. ? ?A/P ?  ?1 hypertension-patient's blood pressure is borderline today but he did bring records and it is typically controlled.  We will continue present medications and follow. ?  ?2 hyperlipidemia-continue statin. ?  ?3 abnormal ECG/PVCs-patient is in ventricular bigeminy today.  We will arrange echocardiogram to assess LV function.  He has not symptomatic and we will therefore not add beta-blockade unless his LV function is reduced. ? ? ? ?After the  initial evaluation, orders, including: Labs, monitoring were initiated. ? ? ?Patient placed on Cardiac and Pulse-Oximetry Monitors. ?The patient was maintained on a cardiac monitor.  The cardiac monitored showed an rhythm of sinus rhythm with PVC, rate 76, borderline ?The patient was also maintained on pulse oximetry. The readings were typically 100% room air normal ? ? ?On repeat evaluation of the patient stayed the same ? ?Lab Tests: ? ?I personally interpreted labs.  The pertinent results include: Minimal decrease in potassium and magnesium ? ? ?Dispostion / Final MDM: ? ?After consideration of the diagnostic results and the patient's response to treatment, is awake, alert, in no distress.  On repeat evaluation he no longer has PVC, but on chart review, as above,  he has a history of these intermittently going back the last few months.  Findings here are reassuring aside from mild abnormality of potassium and magnesium.  Both of these will be repleted, patient is comfortable with, appropriate for outpatient cardiology follow-up as needed.  No evidence for concurrent phenomenon, with no actual complaints on the part of the patient, no evidence for true bradycardia which was suggested on his home pulse oximetry device, no evidence for infection, no evidence for decompensated state.  Patient comfortable with discharge. ? ?Final Clinical Impression(s) / ED Diagnoses ?Final diagnoses:  ?Hypokalemia  ?Hypomagnesemia  ? ?  ?Carmin Muskrat, MD ?04/01/21 1524 ? ?

## 2021-04-01 NOTE — ED Notes (Signed)
[facility] called for pt to be transferred back to his facility. ?

## 2021-04-01 NOTE — Discharge Instructions (Signed)
As discussed, your evaluation today has been largely reassuring.  But, it is important that you monitor your condition carefully, and do not hesitate to return to the ED if you develop new, or concerning changes in your condition. ? ?Otherwise, please follow-up with your physician for appropriate ongoing care. ? ?

## 2021-04-10 ENCOUNTER — Telehealth: Payer: Self-pay | Admitting: Cardiology

## 2021-04-10 NOTE — Telephone Encounter (Signed)
Spoke to patient he stated he took his dog for a walk last Thurs 4/6.Stated during the walk he became dizzy had to sit down twice.Stated he has noticed his heart rate has been low several times.Appointment scheduled with Almyra Deforest PA 4/12 at 2:45 pm.Advised to bring a list of all medications, B/P and pulse readings.I will make Dr.Crenshaw aware. ?

## 2021-04-10 NOTE — Telephone Encounter (Signed)
STAT if patient feels like he/she is going to faint  ? ?Are you dizzy now?  ?No ? ?Do you feel faint or have you passed out?  ?No  ? ?Do you have any other symptoms?  ?No  ? ?Have you checked your HR and BP (record if available)?  ? ?4/03 : 123/52  ?4/04: 127/56 49 ?4/05: 141/53 41 ?4/06: 125/47 38 ?         135/50 40 ?         126/44 36 ?4/07:  147/57 59 ?          148/60 53 ?          127/58 56 ?4/08: 125/55 44 ?4/09: 146/64 53 ?4/10: 137/50 46 ? ? ? ?

## 2021-04-12 ENCOUNTER — Encounter: Payer: Self-pay | Admitting: Physician Assistant

## 2021-04-12 ENCOUNTER — Ambulatory Visit (INDEPENDENT_AMBULATORY_CARE_PROVIDER_SITE_OTHER): Payer: Medicare Other | Admitting: Physician Assistant

## 2021-04-12 VITALS — BP 140/64 | HR 59 | Ht 70.0 in | Wt 168.4 lb

## 2021-04-12 DIAGNOSIS — I10 Essential (primary) hypertension: Secondary | ICD-10-CM

## 2021-04-12 DIAGNOSIS — E785 Hyperlipidemia, unspecified: Secondary | ICD-10-CM

## 2021-04-12 DIAGNOSIS — I493 Ventricular premature depolarization: Secondary | ICD-10-CM | POA: Diagnosis not present

## 2021-04-12 NOTE — Patient Instructions (Addendum)
Medication Instructions:  ?Your physician recommends that you continue on your current medications as directed. Please refer to the Current Medication list given to you today. ? ?*If you need a refill on your cardiac medications before your next appointment, please call your pharmacy* ? ?Lab Work: ?NONE ordered at this time of appointment  ? ?If you have labs (blood work) drawn today and your tests are completely normal, you will receive your results only by: ?MyChart Message (if you have MyChart) OR ?A paper copy in the mail ?If you have any lab test that is abnormal or we need to change your treatment, we will call you to review the results. ? ?Testing/Procedures: ?NONE ordered at this time of appointment  ? ?Follow-Up: ?At [facility], you and your health needs are our priority.  As part of our continuing mission to provide you with exceptional heart care, we have created designated Provider Care Teams.  These Care Teams include your primary Cardiologist (physician) and Advanced Practice Providers (APPs -  Physician Assistants and Nurse Practitioners) who all work together to provide you with the care you need, when you need it. ? ?We recommend signing up for the patient portal called "MyChart".  Sign up information is provided on this After Visit Summary.  MyChart is used to connect with patients for Virtual Visits (Telemedicine).  Patients are able to view lab/test results, encounter notes, upcoming appointments, etc.  Non-urgent messages can be sent to your provider as well.   ?To learn more about what you can do with MyChart, go to [email or web address].   ? ?Your next appointment:   ?3 week(s) ? ?The format for your next appointment:   ?In Person ? ?Provider:   ?Almyra Deforest, PA-C     on a day that Dr. Stanford Breed is in office  ? ?Other Instructions ? ? ?Important Information About Sugar ? ? ? ? ? ? ?

## 2021-04-12 NOTE — Progress Notes (Signed)
?Cardiology Office Note:   ? ?Date:  04/15/2021  ? ?ID:  William Bowen, DOB 1930/12/10, MRN [id number] ? ?PCP:  Jerel Shepherd, MD ?  ?[facility] Providers ?Cardiologist:  Kirk Ruths, MD    ? ?Referring MD: Jerel Shepherd, MD  ? ?Chief Complaint  ?Patient presents with  ? Follow-up  ?  Seen for Dr. Stanford Breed  ? ? ?History of Present Illness:   ? ?William Bowen is a 88 y.o. male with a hx of hypertension, hyperlipidemia, and PVCs.  Carotid Doppler in October 2022 showed no significant stenosis, less than 50% bilaterally.  Abdominal ultrasound in October 2022 showed no aneurysm.  Cardiology service asked to evaluate recently diagnosed PVCs.  Patient was last seen by Dr. Stanford Breed on 01/09/2021 at which time the patient was in ventricular bigeminy.  Subsequent echocardiogram obtained on 01/24/2021 showed EF 55 to 60%, no regional wall motion abnormality, mild to moderate LAE, normal RV function, mild MR, mild AI.  Given the asymptomatic nature and normal EF, he was not started on beta-blocker.  More recently, patient presented to the ED on 04/01/2021 due to concern of bradycardia.  Apparently he checked his heart rate on the pulse ox at home and found to have heart rate down to 35 bpm and he called his physician and was sent to the emergency room.  Potassium was low at 3.2, renal function is normal.  Magnesium low at 1.5.  CBC showed normal hemoglobin he was treated with potassium supplement and 2 g of magnesium sulfate.  EKG demonstrated bigeminy. ? ?Patient presents today for follow-up along with his wife who is also a patient of Dr. Jacalyn Lefevre.  I suspect the recent bradycardia is related to blood pressure cuff and pulse ox not being able to pick up to frequent PVCs.  I do not think the patient truly has bradycardia.  I wanted to order a heart monitor to prove this but apparently her PCP has already went ahead and ordered a heart monitor.  I told the patient I agree with his PCPs recommendation.  He also  mentioned that morning when he had dizziness last Thursday, he did not drink the electrolyte fluid like he usually did.  I suspect there was a component of hypovolemia which triggered the dizziness last Thursday.  He says he was walking his dog up a hill and was super he reached the top of the hill he became really dizzy.  The dizziness was slow onset instead of sudden onset.  I plan to bring the patient back in 3 weeks to review review his heart monitor result. ? ?Past Medical History:  ?Diagnosis Date  ? Hyperlipidemia   ? Hypertension   ? Prostate cancer ([place])   ? ? ?Past Surgical History:  ?Procedure Laterality Date  ? broken arm    ? prostate seed implants    ? ? ?Current Medications: ?Current Meds  ?Medication Sig  ? aspirin 81 MG EC tablet Take 81 mg by mouth daily.  ? atorvastatin (LIPITOR) 40 MG tablet Take 40 mg by mouth daily.  ? bimatoprost (LUMIGAN) 0.01 % SOLN Place 1 drop into both eyes at bedtime.  ? cilostazol (PLETAL) 50 MG tablet Take 50 mg by mouth 2 (two) times daily.  ? cloNIDine (CATAPRES) 0.1 MG tablet Take 3 tablets by mouth 3 (three) times daily.  ? felodipine (PLENDIL) 5 MG 24 hr tablet Take 1 tablet (5 mg total) by mouth 2 (two) times daily.  ? ferrous sulfate 325 (65 FE) MG  tablet Take 325 mg by mouth 2 (two) times daily.  ? furosemide (LASIX) 20 MG tablet Take 20 mg by mouth daily as needed.  ? hydrALAZINE (APRESOLINE) 50 MG tablet Take 100 mg by mouth in the morning and at bedtime.  ? magnesium oxide (MAG-OX) 400 (240 Mg) MG tablet Take 1 tablet by mouth daily.  ? memantine (NAMENDA) 5 MG tablet Take 1 tablet (5 mg total) by mouth 2 (two) times daily.  ? omeprazole (PRILOSEC) 40 MG capsule Take 40 mg by mouth at bedtime.  ? potassium chloride (KLOR-CON) 10 MEQ tablet Take 10 mEq by mouth daily.  ? tamsulosin (FLOMAX) 0.4 MG CAPS capsule Take 2 capsules by mouth daily.  ? temazepam (RESTORIL) 7.5 MG capsule Take 15 mg by mouth at bedtime.  ?  ? ?Allergies:   Patient has no known  allergies.  ? ?Social History  ? ?Socioeconomic History  ? Marital status: Married  ?  Spouse name: Not on file  ? Number of children: 3  ? Years of education: Not on file  ? Highest education level: Not on file  ?Occupational History  ? Not on file  ?Tobacco Use  ? Smoking status: Former  ? Smokeless tobacco: Never  ?Substance and Sexual Activity  ? Alcohol use: Not Currently  ? Drug use: Never  ? Sexual activity: Not on file  ?Other Topics Concern  ? Not on file  ?Social History Narrative  ? Not on file  ? ?Social Determinants of Health  ? ?Financial Resource Strain: Not on file  ?Food Insecurity: Not on file  ?Transportation Needs: Not on file  ?Physical Activity: Not on file  ?Stress: Not on file  ?Social Connections: Not on file  ?  ? ?Family History: ?The patient's family history includes AAA (abdominal aortic aneurysm) in his mother. ? ?ROS:   ?Please see the history of present illness.    ? All other systems reviewed and are negative. ? ?EKGs/Labs/Other Studies Reviewed:   ? ?The following studies were reviewed today: ? ?Echo 01/24/2021 ? 1. Frequent PVC's. Left ventricular ejection fraction, by estimation, is  ?55 to 60%. The left ventricle has normal function. The left ventricle has  ?no regional wall motion abnormalities. Left ventricular diastolic  ?parameters are indeterminate.  ? 2. Right ventricular systolic function is normal. The right ventricular  ?size is normal.  ? 3. Left atrial size was mild to moderately dilated.  ? 4. Right atrial size was mildly dilated.  ? 5. The mitral valve is normal in structure. Mild mitral valve  ?regurgitation. No evidence of mitral stenosis.  ? 6. The aortic valve is tricuspid. Aortic valve regurgitation is mild. No  ?aortic stenosis is present.  ? 7. Aortic Ascending aorta NWV. There is mild dilatation of the aortic  ?root, measuring 39 mm.  ? ?EKG:  EKG is ordered today.  The ekg ordered today demonstrates normal sinus rhythm, right bundle branch block. ? ?Recent  Labs: ?04/01/2021: ALT 17; BUN 18; Creatinine, Ser 0.84; Hemoglobin 13.4; Magnesium 1.5; Platelets 147; Potassium 3.2; Sodium 137  ?Recent Lipid Panel ?No results found for: CHOL, TRIG, HDL, CHOLHDL, VLDL, LDLCALC, LDLDIRECT ? ? ?Risk Assessment/Calculations:   ?  ? ?    ? ?Physical Exam:   ? ?VS:  BP 140/64   Pulse (!) 59   Ht '5\' 10"'$  (1.778 m)   Wt 168 lb 6.4 oz (76.4 kg)   SpO2 96%   BMI 24.16 kg/m?    ? ?Wt Readings  from Last 3 Encounters:  ?04/12/21 168 lb 6.4 oz (76.4 kg)  ?01/09/21 170 lb 6.4 oz (77.3 kg)  ?01/29/20 163 lb (73.9 kg)  ?  ? ?GEN:  Well nourished, well developed in no acute distress ?HEENT: Normal ?NECK: No JVD; No carotid bruits ?LYMPHATICS: No lymphadenopathy ?CARDIAC: RRR, no murmurs, rubs, gallops ?RESPIRATORY:  Clear to auscultation without rales, wheezing or rhonchi  ?ABDOMEN: Soft, non-tender, non-distended ?MUSCULOSKELETAL:  No edema; No deformity  ?SKIN: Warm and dry ?NEUROLOGIC:  Alert and oriented x 3 ?PSYCHIATRIC:  Normal affect  ? ?ASSESSMENT:   ? ?1. PVCs (premature ventricular contractions)   ?2. Primary hypertension   ?3. Hyperlipidemia LDL goal <100   ? ?PLAN:   ? ?In order of problems listed above: ? ?Frequent PVCs: Patient recently went to the emergency room for possible bradycardia.  He has a history of bigeminy and frequent PVCs.  I suspect his home blood pressure cuff and oximeter falsely interpreted bradycardia due to inability to pick up PVCs.  His PCP apparently ordered a heart monitor for him.  We will follow-up on the result of the heart monitor to make sure he does not have significant bradycardia or prolonged pauses.  Suspicion is low for symptomatic bradycardia based on his presentation. ? ?Hypertension: Blood pressure is stable ? ?Hyperlipidemia: On Lipitor. ? ?   ? ?   ? ? ?Medication Adjustments/Labs and Tests Ordered: ?Current medicines are reviewed at length with the patient today.  Concerns regarding medicines are outlined above.  ?Orders Placed This  Encounter  ?Procedures  ? EKG 12-Lead  ? ?No orders of the defined types were placed in this encounter. ? ? ?Patient Instructions  ?Medication Instructions:  ?Your physician recommends that you continue on your current medication

## 2021-04-15 ENCOUNTER — Encounter: Payer: Self-pay | Admitting: Physician Assistant

## 2021-04-18 ENCOUNTER — Telehealth: Payer: Self-pay | Admitting: Cardiology

## 2021-04-18 NOTE — Telephone Encounter (Signed)
Patient called in to triage to report heart rate in the 40s to 50s. BP 140/50. Took his pulse 52 and regular except for a couple of skipped beats. His energy level was decreased, but currently has improved. Denies dizziness, lightheadedness, or wanting to pass out. Speech clear. Yesterday BP 129/52, P 57. Recommended for patient to call [phone] to be connected with answering service. ?

## 2021-04-18 NOTE — Telephone Encounter (Signed)
?  STAT if HR is under 50 or over 120 ?(normal HR is 60-100 beats per minute) ? ?What is your heart rate? 40 ? ?Do you have a log of your heart rate readings (document readings)? 52, 38, 40 ? ?Do you have any other symptoms? no ?

## 2021-04-27 ENCOUNTER — Ambulatory Visit: Payer: Medicare Other

## 2021-04-27 ENCOUNTER — Encounter: Payer: Self-pay | Admitting: *Deleted

## 2021-04-27 ENCOUNTER — Telehealth: Payer: Self-pay | Admitting: Physician Assistant

## 2021-04-27 ENCOUNTER — Other Ambulatory Visit: Payer: Self-pay

## 2021-04-27 DIAGNOSIS — I493 Ventricular premature depolarization: Secondary | ICD-10-CM

## 2021-04-27 NOTE — Telephone Encounter (Signed)
Please check to see if he has finished wearing the heart monitor ordered by his PCP, if so, please request the record from his PCP's office. I suspected he does not have true bradycardia, instead his BP cuff was falsely interpreting slow HR because it is not picking up the frequent PVCs

## 2021-04-27 NOTE — Telephone Encounter (Signed)
See separate phone message from today

## 2021-04-27 NOTE — Telephone Encounter (Signed)
Called patient to inquire if he received and wore the heart monitor ordered by his PCP. Patient stated that he is still waiting on the monitor. William Bowen spoke with patient.  ?

## 2021-04-27 NOTE — Progress Notes (Unsigned)
Enrolled for [employer] to mail a ZIO XT long term holter monitor to the patients address on file.  ? ?Letter with instructions mailed to patient. ? ?Dr. Stanford Breed to read. ?

## 2021-04-27 NOTE — Telephone Encounter (Signed)
I have called and spoke with the patient, he has not received a heart monitor from his PCP.  I called and spoke with Dr. Cheral Almas his primary care provider.  They are having issues with their monitoring company and that there are delays.  His PCP suggested we can order the heart monitor from our perspective.  I will go ahead and order a 2-week ZIO monitor.  We will push back his next weeks follow-up to roughly 6 weeks from now. ?

## 2021-04-27 NOTE — Telephone Encounter (Signed)
Placed the order for a 14 day Zio  ?

## 2021-05-04 ENCOUNTER — Ambulatory Visit: Payer: Medicare Other | Admitting: Physician Assistant

## 2021-06-22 ENCOUNTER — Ambulatory Visit: Payer: Medicare Other | Admitting: Physician Assistant

## 2021-06-28 ENCOUNTER — Encounter: Payer: Medicare Other | Admitting: Physician Assistant

## 2021-06-28 NOTE — Progress Notes (Signed)
This encounter was created in error - please disregard.

## 2021-07-03 ENCOUNTER — Encounter: Payer: Self-pay | Admitting: Physician Assistant

## 2021-07-24 ENCOUNTER — Telehealth: Payer: Self-pay | Admitting: Cardiology

## 2021-07-24 DIAGNOSIS — I493 Ventricular premature depolarization: Secondary | ICD-10-CM

## 2021-07-24 NOTE — Telephone Encounter (Signed)
Spoke to patient he stated his heart beat was slow this past Saturday morning 34 B/P 142/46 PM-33  B/P 129/48.This morning  Pulse 45  B/P 156/56 this afternoon Pulse 42  B/P 135/43.Stated he has no dizziness or no light headed,just felt alittle tired.Stated last Sunday morning  Pulse 34 B/P 147/46  PM Pulse 33 B/P 129/40. Advised I will send message to [facility] for advice.

## 2021-07-24 NOTE — Telephone Encounter (Signed)
STAT if HR is under 50 or over 120 (normal HR is 60-100 beats per minute)  What is your heart rate? 34, 33, 37  Do you have a log of your heart rate readings (document readings)? Yes   Do you have any other symptoms? No

## 2021-07-25 ENCOUNTER — Ambulatory Visit (INDEPENDENT_AMBULATORY_CARE_PROVIDER_SITE_OTHER): Payer: Medicare Other

## 2021-07-25 DIAGNOSIS — I493 Ventricular premature depolarization: Secondary | ICD-10-CM

## 2021-07-25 NOTE — Telephone Encounter (Signed)
Spoke with pt wife, Aware of dr crenshaw's recommendations.  

## 2021-07-25 NOTE — Progress Notes (Unsigned)
Enrolled patient for a 3 day Zio XT monitor to be mailed to patients home  

## 2021-07-30 DIAGNOSIS — I493 Ventricular premature depolarization: Secondary | ICD-10-CM | POA: Diagnosis not present

## 2022-01-02 NOTE — Progress Notes (Deleted)
HPI: FU hypertension, hyperlipidemia and PVCs.  Carotid Dopplers October 2022 showed no significant stenosis with less than 50% bilaterally.  Abdominal ultrasound October 2022 showed no aneurysm.  Echocardiogram January 2023 showed normal LV function, mild to moderate left atrial enlargement, mild right atrial enlargement, mild mitral vegetation, mild aortic insufficiency.  Monitor August 2023 showed sinus rhythm with PACs, short runs of SVT (possible atrial flutter), PVCs and 8 beats nonsustained ventricular tachycardia.  Patient has been treated medically.  Since last seen  Current Outpatient Medications  Medication Sig Dispense Refill   aspirin 81 MG EC tablet Take 81 mg by mouth daily.     atorvastatin (LIPITOR) 40 MG tablet Take 40 mg by mouth daily.     bimatoprost (LUMIGAN) 0.01 % SOLN Place 1 drop into both eyes at bedtime.     cilostazol (PLETAL) 50 MG tablet Take 50 mg by mouth 2 (two) times daily.     cloNIDine (CATAPRES) 0.1 MG tablet Take 3 tablets by mouth 3 (three) times daily.     felodipine (PLENDIL) 5 MG 24 hr tablet Take 1 tablet (5 mg total) by mouth 2 (two) times daily.     ferrous sulfate 325 (65 FE) MG tablet Take 325 mg by mouth 2 (two) times daily.     furosemide (LASIX) 20 MG tablet Take 20 mg by mouth daily as needed.     hydrALAZINE (APRESOLINE) 50 MG tablet Take 100 mg by mouth in the morning and at bedtime.     magnesium oxide (MAG-OX) 400 (240 Mg) MG tablet Take 1 tablet by mouth daily.     memantine (NAMENDA) 5 MG tablet Take 1 tablet (5 mg total) by mouth 2 (two) times daily.     omeprazole (PRILOSEC) 40 MG capsule Take 40 mg by mouth at bedtime.     potassium chloride (KLOR-CON) 10 MEQ tablet Take 10 mEq by mouth daily. 90 tablet 3   tamsulosin (FLOMAX) 0.4 MG CAPS capsule Take 2 capsules by mouth daily.     temazepam (RESTORIL) 7.5 MG capsule Take 15 mg by mouth at bedtime.     No current facility-administered medications for this visit.     Past  Medical History:  Diagnosis Date   Hyperlipidemia    Hypertension    Prostate cancer [facility])     Past Surgical History:  Procedure Laterality Date   broken arm     prostate seed implants      Social History   Socioeconomic History   Marital status: Married    Spouse name: Not on file   Number of children: 3   Years of education: Not on file   Highest education level: Not on file  Occupational History   Not on file  Tobacco Use   Smoking status: Former   Smokeless tobacco: Never  Substance and Sexual Activity   Alcohol use: Not Currently   Drug use: Never   Sexual activity: Not on file  Other Topics Concern   Not on file  Social History Narrative   Not on file   Social Determinants of Health   Financial Resource Strain: Not on file  Food Insecurity: Not on file  Transportation Needs: Not on file  Physical Activity: Not on file  Stress: Not on file  Social Connections: Not on file  Intimate Partner Violence: Not on file    Family History  Problem Relation Age of Onset   AAA (abdominal aortic aneurysm) Mother     ROS:  no fevers or chills, productive cough, hemoptysis, dysphasia, odynophagia, melena, hematochezia, dysuria, hematuria, rash, seizure activity, orthopnea, PND, pedal edema, claudication. Remaining systems are negative.  Physical Exam: Well-developed well-nourished in no acute distress.  Skin is warm and dry.  HEENT is normal.  Neck is supple.  Chest is clear to auscultation with normal expansion.  Cardiovascular exam is regular rate and rhythm.  Abdominal exam nontender or distended. No masses palpated. Extremities show no edema. neuro grossly intact  ECG- personally reviewed  A/P  1 PVCs/nonsustained ventricular tachycardia-previous echocardiogram showed normal LV function.  He does not have any symptoms and I have therefore treated conservatively.  Will add Toprol 25 mg daily.  2 hypertension-blood pressure controlled.  Continue present  medical regimen.  3 hyperlipidemia-continue statin.  Kirk Ruths, MD

## 2022-01-15 ENCOUNTER — Ambulatory Visit: Payer: Medicare Other | Admitting: Cardiology

## 2023-10-02 DEATH — deceased
